# Patient Record
Sex: Female | Born: 1970 | Race: White | Hispanic: No | Marital: Single | Smoking: Current every day smoker
Health system: Southern US, Community
[De-identification: ages and names within clinical notes are randomized; demographics above are authoritative.]

## PROBLEM LIST (undated history)

## (undated) DIAGNOSIS — I10 Essential (primary) hypertension: Secondary | ICD-10-CM

---

## 1999-12-03 ENCOUNTER — Other Ambulatory Visit: Admission: RE | Admit: 1999-12-03 | Discharge: 1999-12-03 | Payer: Self-pay | Admitting: Obstetrics and Gynecology

## 2006-11-13 ENCOUNTER — Emergency Department (HOSPITAL_COMMUNITY): Admission: EM | Admit: 2006-11-13 | Discharge: 2006-11-13 | Payer: Self-pay | Admitting: Emergency Medicine

## 2009-05-30 ENCOUNTER — Ambulatory Visit: Payer: Self-pay | Admitting: Obstetrics & Gynecology

## 2009-06-04 ENCOUNTER — Encounter: Admission: RE | Admit: 2009-06-04 | Discharge: 2009-06-04 | Payer: Self-pay | Admitting: Obstetrics & Gynecology

## 2010-12-27 ENCOUNTER — Emergency Department (HOSPITAL_COMMUNITY): Payer: Self-pay

## 2010-12-27 ENCOUNTER — Emergency Department (INDEPENDENT_AMBULATORY_CARE_PROVIDER_SITE_OTHER): Payer: Self-pay

## 2010-12-27 ENCOUNTER — Emergency Department (HOSPITAL_BASED_OUTPATIENT_CLINIC_OR_DEPARTMENT_OTHER)
Admission: EM | Admit: 2010-12-27 | Discharge: 2010-12-27 | Disposition: A | Discharge status: Other Acute Inpatient Hospital | Payer: Self-pay | Attending: Emergency Medicine | Admitting: Emergency Medicine

## 2010-12-27 DIAGNOSIS — R079 Chest pain, unspecified: Secondary | ICD-10-CM | POA: Insufficient documentation

## 2010-12-27 LAB — POCT CARDIAC MARKERS
CKMB, poc: 1.3 ng/mL (ref 1.0–8.0)
Myoglobin, poc: 89.2 ng/mL (ref 12–200)
Myoglobin, poc: 60.4 ng/mL (ref 12–200)
Troponin i, poc: <0.05 ng/mL (ref 0.00–0.09)

## 2010-12-27 LAB — CBC
HCT: 38.5 % (ref 36.0–46.0)
Hemoglobin: 13.8 g/dL (ref 12.0–15.0)
MCHC: 35.8 g/dL (ref 30.0–36.0)
RBC: 4.65 MIL/uL (ref 3.87–5.11)
WBC: 8.1 10*3/uL (ref 4.0–10.5)

## 2010-12-27 LAB — DIFFERENTIAL
Basophils Absolute: 0 10*3/uL (ref 0.0–0.1)
Basophils Relative: 0 % (ref 0–1)
Lymphocytes Relative: 26 % (ref 12–46)
Monocytes Absolute: 0.4 10*3/uL (ref 0.1–1.0)
Neutro Abs: 5.3 10*3/uL (ref 1.7–7.7)
Neutrophils Relative %: 66 % (ref 43–77)

## 2010-12-27 LAB — BASIC METABOLIC PANEL
CO2: 23 mEq/L (ref 19–32)
Chloride: 111 mEq/L (ref 96–112)
Creatinine, Ser: 0.8 mg/dL (ref 0.4–1.2)
GFR calc Af Amer: >60 mL/min (ref >60)
Potassium: 3.9 mEq/L (ref 3.5–5.1)
Sodium: 142 mEq/L (ref 135–145)

## 2010-12-27 LAB — D-DIMER, QUANTITATIVE: D-Dimer, Quant: <0.22 ug/mL-FEU (ref 0.00–0.48)

## 2011-04-08 NOTE — Assessment & Plan Note (Signed)
Miranda Shepard, Miranda Shepard          ACCOUNT NO.:  [id number]   MEDICAL RECORD NO.:  [id number]          PATIENT TYPE:  POB   LOCATION:  [facility] at [place]         FACILITY:  [facility]   PHYSICIAN:  Elsie Lincoln, MD      DATE OF BIRTH:  12-18-1970   DATE OF SERVICE:  05/30/2009                                  CLINIC NOTE   The patient is a 40 year old G0 female whose LMP is May 26, 2009, who  presents for a lump in her left breast which has been very tender for  the last week, used to be red and hot, but has significantly decreased  in size and redness, the tenderness is gone now.  The patient denies any  nipple drainage or fevers.  The patient does have a history of boils in  her axilla that are painful and burst and drain.  She does not have any  of these currently now either.  The patient does have a history of  breast cancer in her mother.  Mother is diagnosed with breast cancer  around age 38.  Her mother is living.  She does not know if her mother  has had BRCA1 and 2 testing.  We have advised the patient to ask her  mother if she has been tested.  If she has not been tested, I advise  that her mother be tested.  If her mother refuses to be tested, then I  advise the patient to be tested.  Also of note, the patient has not had  a Pap smear since 1995.  I suggest that the patient come back for  complete physical exam.  The patient has a history of polyps in her  colon and has not had adequate followup.  I referred her to a  gastroenterologist to get a followup colonoscopy.   PAST MEDICAL HISTORY:  Arthritis, polyps in the intestine.   PAST SURGICAL HISTORY:  None.   OBSTETRICAL HISTORY:  Para 0.   GYNECOLOGIC HISTORY:  No abnormal Pap smears.   MENSTRUAL HISTORY:  Menarche at age 32, regular menstrual cycles with 27  days' cycles, menses last 3-7 days with heavy flow, moderate pain with  no bleeding between periods.  She is not sexually active currently.   ALLERGIES:   ASPIRIN.  No latex allergy.   MEDICATIONS:  None.   FAMILY HISTORY:  Uncle with diabetes.  Mother has had a heart attack.  Mother and brother have high blood pressure.  Mother has had  premenopausal breast cancer.  Great aunt and uncle have colon cancer.   SOCIAL HISTORY:  She smokes one pack per day for 22 years.  She does not  drink alcohol.  She does drink caffeinated beverages.  Does not do drugs  and has never been a victim of sexual or physical abuse.   Systemic review is positive for frequent headaches, nausea, vomiting,  and vaginal itching at the end of her menses.   PHYSICAL EXAMINATION:  GENERAL:  Well nourished, well developed in no  apparent distress.  HEENT:  Normocephalic, atraumatic.  Very poor dentition.  The patient is  going for dentures later this week.  CHEST:  Normal movement.  AXILLA:  Evidence  of old scars.  No lymphadenopathy.  BREASTS:  Small mass in the left breast around 1 o'clock, mildly tender.  No nipple discharge.  The rest of breasts bilaterally are normal.   ASSESSMENT/PLAN:  A 40 year old female with probable resolving left  breast abscess.  1. Diagnostic mammogram of left breast.  2. Refer to gastroenterologist for history of polyps.  3. Scheduled for a Pap smear and complete physical exam.  4. The patient to follow up with mother for history of breast cancer.           ______________________________  Elsie Lincoln, MD     KL/MEDQ  D:  05/30/2009  T:  05/31/2009  Job:  [id number]

## 2011-09-20 IMAGING — CR DG CHEST 2V
2 series · 2 of 2 positions shown · non-contrast
Comparison: None.

CLINICAL DATA: Chest pain

CHEST - 2 VIEW

[w chest pa]
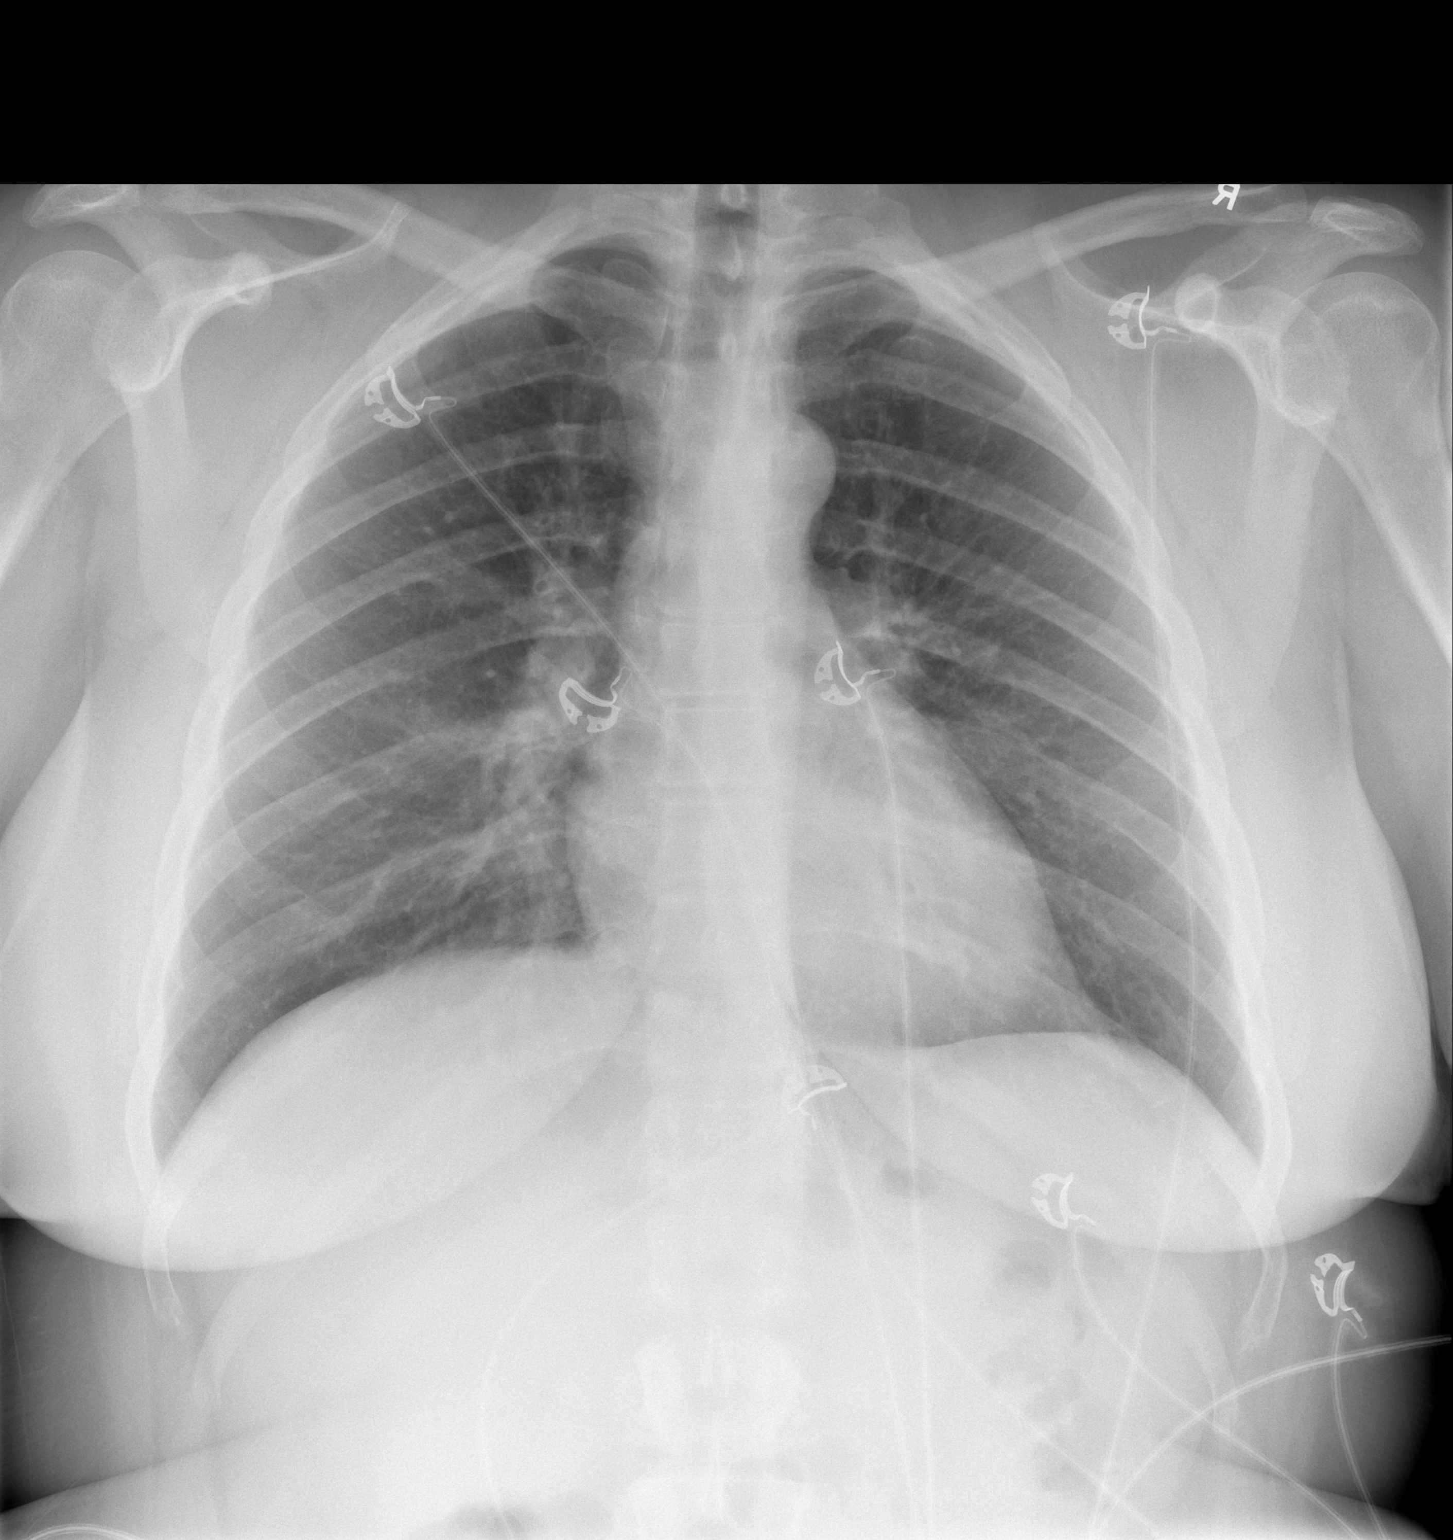

[w chest lat]
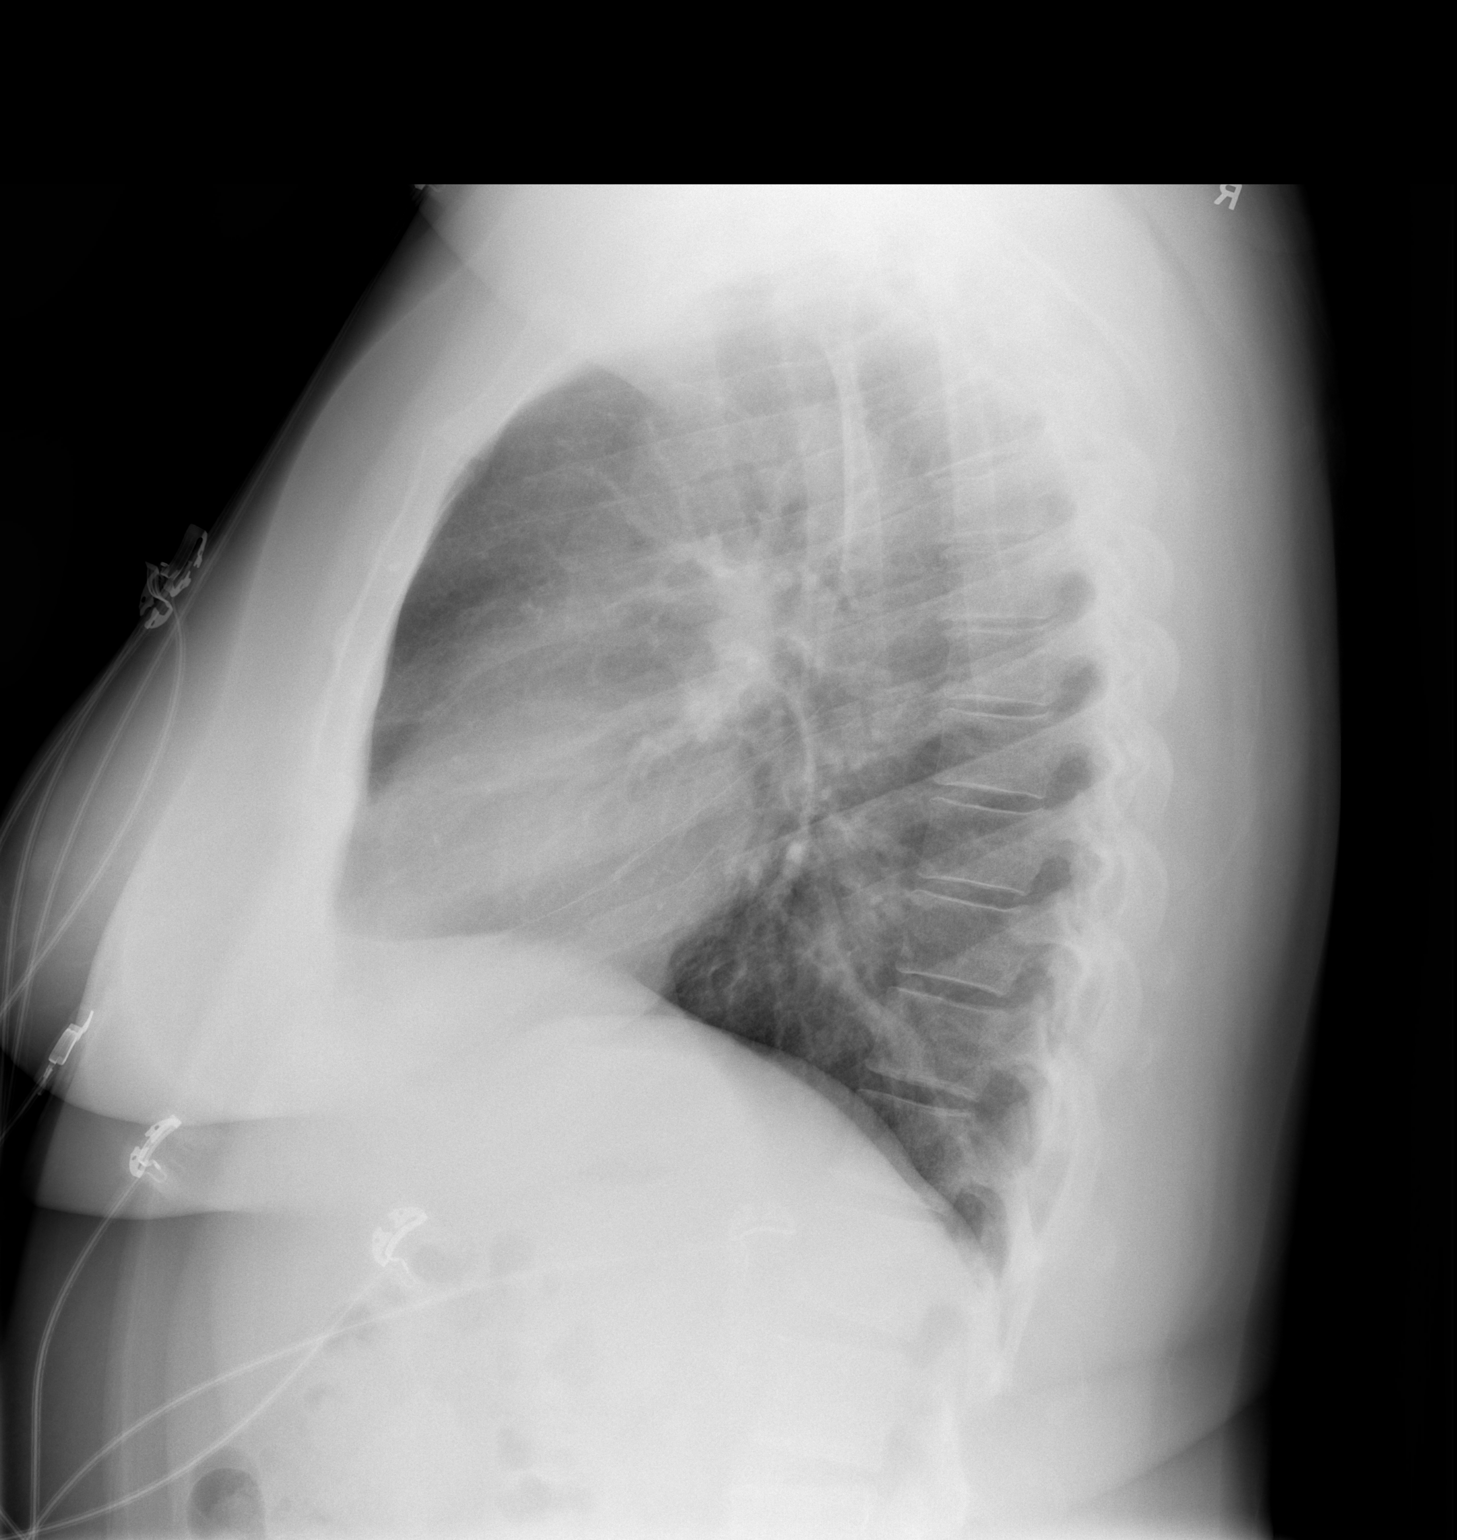

[2 of 2 positions shown; findings below may reference images not displayed]

FINDINGS: Heart and mediastinal contours normal.  Lungs clear.  No
pleural fluid.  Osseous structures and soft tissues unremarkable.
IMPRESSION: No acute or significant findings.

## 2016-10-06 ENCOUNTER — Other Ambulatory Visit (HOSPITAL_COMMUNITY)
Admission: RE | Admit: 2016-10-06 | Discharge: 2016-10-06 | Disposition: A | Discharge status: Home / Self Care | Payer: Managed Care, Other (non HMO) | Source: Ambulatory Visit | Attending: Obstetrics & Gynecology | Admitting: Obstetrics & Gynecology

## 2016-10-06 ENCOUNTER — Other Ambulatory Visit: Payer: Self-pay | Admitting: Obstetrics & Gynecology

## 2016-10-06 DIAGNOSIS — Z01419 Encounter for gynecological examination (general) (routine) without abnormal findings: Secondary | ICD-10-CM | POA: Insufficient documentation

## 2016-10-06 DIAGNOSIS — Z1151 Encounter for screening for human papillomavirus (HPV): Secondary | ICD-10-CM | POA: Diagnosis not present

## 2016-10-09 LAB — CYTOLOGY - PAP
DIAGNOSIS: NEGATIVE
HPV: NOT DETECTED

## 2016-11-05 ENCOUNTER — Other Ambulatory Visit: Payer: Self-pay | Admitting: Obstetrics & Gynecology

## 2017-07-27 ENCOUNTER — Other Ambulatory Visit: Payer: Self-pay

## 2017-07-27 ENCOUNTER — Encounter (HOSPITAL_COMMUNITY): Payer: Self-pay | Admitting: *Deleted

## 2017-07-27 ENCOUNTER — Emergency Department (HOSPITAL_COMMUNITY): Payer: No Typology Code available for payment source

## 2017-07-27 ENCOUNTER — Emergency Department (HOSPITAL_COMMUNITY)
Admission: EM | Admit: 2017-07-27 | Discharge: 2017-07-27 | Disposition: A | Discharge status: Home / Self Care | Payer: No Typology Code available for payment source | Attending: Emergency Medicine | Admitting: Emergency Medicine

## 2017-07-27 DIAGNOSIS — I10 Essential (primary) hypertension: Secondary | ICD-10-CM | POA: Diagnosis not present

## 2017-07-27 DIAGNOSIS — R531 Weakness: Secondary | ICD-10-CM | POA: Diagnosis present

## 2017-07-27 DIAGNOSIS — R0789 Other chest pain: Secondary | ICD-10-CM | POA: Diagnosis not present

## 2017-07-27 DIAGNOSIS — F17210 Nicotine dependence, cigarettes, uncomplicated: Secondary | ICD-10-CM | POA: Insufficient documentation

## 2017-07-27 DIAGNOSIS — J45909 Unspecified asthma, uncomplicated: Secondary | ICD-10-CM | POA: Diagnosis not present

## 2017-07-27 DIAGNOSIS — R42 Dizziness and giddiness: Secondary | ICD-10-CM | POA: Diagnosis not present

## 2017-07-27 HISTORY — DX: Essential (primary) hypertension: I10

## 2017-07-27 LAB — URINALYSIS, ROUTINE W REFLEX MICROSCOPIC
Bilirubin Urine: NEGATIVE
GLUCOSE, UA: NEGATIVE mg/dL
KETONES UR: NEGATIVE mg/dL
LEUKOCYTES UA: NEGATIVE
Nitrite: NEGATIVE
PH: 5 (ref 5.0–8.0)
Protein, ur: NEGATIVE mg/dL
RBC / HPF: NONE SEEN RBC/hpf (ref 0–5)
SPECIFIC GRAVITY, URINE: 1.005 (ref 1.005–1.030)
WBC, UA: NONE SEEN WBC/hpf (ref 0–5)

## 2017-07-27 LAB — HEPATIC FUNCTION PANEL
ALBUMIN: 3.5 g/dL (ref 3.5–5.0)
ALT: 15 U/L (ref 14–54)
AST: 21 U/L (ref 15–41)
Alkaline Phosphatase: 65 U/L (ref 38–126)
Bilirubin, Direct: <0.1 mg/dL — ABNORMAL LOW (ref 0.1–0.5)
TOTAL PROTEIN: 6.5 g/dL (ref 6.5–8.1)
Total Bilirubin: 0.3 mg/dL (ref 0.3–1.2)

## 2017-07-27 LAB — BASIC METABOLIC PANEL
Anion gap: 8 (ref 5–15)
BUN: 7 mg/dL (ref 6–20)
CHLORIDE: 106 mmol/L (ref 101–111)
CO2: 24 mmol/L (ref 22–32)
CREATININE: 0.76 mg/dL (ref 0.44–1.00)
Calcium: 8.5 mg/dL — ABNORMAL LOW (ref 8.9–10.3)
GFR calc Af Amer: >60 mL/min (ref >60)
Glucose, Bld: 156 mg/dL — ABNORMAL HIGH (ref 65–99)
Potassium: 3.7 mmol/L (ref 3.5–5.1)
SODIUM: 138 mmol/L (ref 135–145)

## 2017-07-27 LAB — CBC
HCT: 37.3 % (ref 36.0–46.0)
Hemoglobin: 12.8 g/dL (ref 12.0–15.0)
MCH: 29.5 pg (ref 26.0–34.0)
MCHC: 34.3 g/dL (ref 30.0–36.0)
MCV: 85.9 fL (ref 78.0–100.0)
PLATELETS: 272 10*3/uL (ref 150–400)
RBC: 4.34 MIL/uL (ref 3.87–5.11)
RDW: 13.1 % (ref 11.5–15.5)
WBC: 7.8 10*3/uL (ref 4.0–10.5)

## 2017-07-27 LAB — I-STAT TROPONIN, ED: Troponin i, poc: 0.01 ng/mL (ref 0.00–0.08)

## 2017-07-27 LAB — CBG MONITORING, ED: Glucose-Capillary: 160 mg/dL — ABNORMAL HIGH (ref 65–99)

## 2017-07-27 MED ORDER — ASPIRIN EC 325 MG PO TBEC
325.0000 mg | DELAYED_RELEASE_TABLET | Freq: Once | ORAL | Status: AC
  Administered 2017-07-27: 325 mg via ORAL
  Filled 2017-07-27: qty 1

## 2017-07-27 MED ORDER — SODIUM CHLORIDE 0.9 % IV BOLUS (SEPSIS)
500.0000 mL | Freq: Once | INTRAVENOUS | Status: AC
  Administered 2017-07-27: 500 mL via INTRAVENOUS

## 2017-07-27 MED ORDER — ONDANSETRON HCL 4 MG/2ML IJ SOLN
4.0000 mg | Freq: Once | INTRAMUSCULAR | Status: AC
  Administered 2017-07-27: 4 mg via INTRAVENOUS
  Filled 2017-07-27: qty 2

## 2017-07-27 NOTE — ED Triage Notes (Signed)
Pt c/o dizziness onset x 1 mth intermittent with worsening over the last 3 days, denies vision changes, denies n/v/d. Pt A&O x4, no facial droop present, no slurred speech

## 2017-07-27 NOTE — ED Notes (Signed)
Patient reported feeling weak and light headed while standing.  Vital signs were unchanged.

## 2017-07-27 NOTE — ED Provider Notes (Signed)
[facility]   CSN: [id number] Arrival date & time: 07/27/17  1035     History   Chief Complaint No chief complaint on file.   HPI Miranda Shepard is a 46 y.o. female with a h/o of asthma and HTN who presents to the emergency department with a chief complaint of generalized weakness. 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 She also reports associated worse than baseline dyspnea that began this morning.  She reports intermittent lightheadedness, feeling as if "she might pass out" that would last for a few hours over the last week, but became constant 3 nights ago. She reports that when she felt an episode of lightheadedness coming on that she would go lay down, and the symptom would resolve when she awoke until 3 nights ago.   She also reports constant, non-radiating left-sided chest pressure that began 2 days ago. She denies numbness, tingling, or diaphoresis.   She reports that she has not eaten this morning, and has only drank one [employer] since this AM.   Past medical history includes asthma and hypertension for which she takes albuterol as needed, ASA, Norvasc, and losartan. She is a current, every day smoker.  Family medical history includes her brother who had an MI at age 48 and CABG x3 at age 48, and her mother who had an MI in her 1640s. She reports she is not established with cardiology, but was sent for a stress test by her PCP in January, which was unremarkable.   The history is provided by the patient and the spouse. No language interpreter was used.    Past Medical History:  Diagnosis Date  . Hypertension     There are no active problems to display for this patient.   History reviewed. No pertinent surgical history.  OB History    No data  available       Home Medications    Prior to Admission medications   Not on File    Family History No family history on file.  Social History Social History  Substance Use Topics  . Smoking status: Current Every Day Smoker    Packs/day: 0.50    Types: Cigarettes  . Smokeless tobacco: Never Used  . Alcohol use No     Allergies   Patient has no allergy information on record.   Review of Systems Review of Systems  Constitutional: Negative for activity change, chills and fever.  Eyes: Negative for visual disturbance.  Respiratory: Positive for cough and shortness of breath.   Cardiovascular: Positive for chest pain.  Gastrointestinal: Positive for nausea. Negative for abdominal pain, diarrhea and vomiting.  Musculoskeletal: Negative for arthralgias, back pain and myalgias.  Skin: Negative for rash.  Neurological: Positive for weakness and light-headedness.   Physical Exam Updated Vital Signs BP 115/67   Pulse 66   Temp 98.5 F (36.9 C) (Oral)   Resp 18   Ht 5\' 3"  (1.6 m)   Wt 108.9 kg (240 lb)   SpO2 100%   BMI 42.51 kg/m   Physical Exam  Constitutional: No distress.  Obese female  HENT:  Head: Normocephalic.  Eyes: Conjunctivae are normal.  Neck: Neck supple. No JVD present.  Cardiovascular: Normal rate, regular rhythm, normal heart sounds and intact distal pulses.  Exam reveals no gallop and no friction rub.   No murmur heard. 2+ and equal radial, DP, and PT pulses.  Pulmonary/Chest: Effort normal and breath sounds normal. No respiratory distress. She has no wheezes. She has no rales. She exhibits no tenderness.  Abdominal: Soft. She exhibits no distension and no mass. There is tenderness. There is no rebound and no guarding. No hernia.  Mild tenderness to palpation in the right upper quadrant. No CVA tenderness bilaterally.   Neurological: She is alert.  Cranial nerves 2-12 intact. Finger-to-nose is normal. 5/5 motor strength of the bilateral upper  and lower extremities. Moves all four extremities. Negative Romberg. Ambulatory without difficulty. NVI.    Skin: Skin is warm. Capillary refill takes less than 2 seconds. No rash noted.  Psychiatric: Her behavior is normal.  Nursing note and vitals reviewed.  ED Treatments / Results  Labs (all labs ordered are listed, but only abnormal results are displayed) Labs Reviewed  BASIC METABOLIC PANEL - Abnormal; Notable for the following:       Result Value   Glucose, Bld 156 (*)    Calcium 8.5 (*)    All other components within normal limits  URINALYSIS, ROUTINE W REFLEX MICROSCOPIC - Abnormal; Notable for the following:    Color, Urine COLORLESS (*)    Hgb urine dipstick SMALL (*)    Bacteria, UA RARE (*)    Squamous Epithelial / LPF 0-5 (*)    All other components within normal limits  HEPATIC FUNCTION PANEL - Abnormal; Notable for the following:    Bilirubin, Direct <0.1 (*)    All other components within normal limits  CBG MONITORING, ED - Abnormal; Notable for the following:    Glucose-Capillary 160 (*)    All other components within normal limits  CBC  I-STAT TROPONIN, ED    EKG  EKG Interpretation  Date/Time:  Monday July 27 2017 10:41:21 EDT Ventricular Rate:  80 PR Interval:  164 QRS Duration: 86 QT Interval:  404 QTC Calculation: 465 R Axis:   49 Text Interpretation:  Normal sinus rhythm Normal ECG No old tracing to compare Confirmed by [place], Doreatha Martin [phone]) on 07/27/2017 2:49:25 PM       Radiology Dg Chest 2 View  Result Date: 07/27/2017 CLINICAL DATA:  Left chest pressure, dyspnea, dizziness EXAM: CHEST  2 VIEW COMPARISON:  05/28/2017 FINDINGS: The heart size and mediastinal contours are within normal limits. Both lungs are clear. The visualized skeletal structures are unremarkable. IMPRESSION: No active cardiopulmonary disease. Electronically Signed   By: Judie Petit.  Shick M.D.   On: 07/27/2017 13:06    Procedures Procedures (including critical care  time)  Medications Ordered in ED Medications  aspirin EC tablet 325 mg (325 mg Oral Given 07/27/17 1239)  sodium chloride 0.9 % bolus 500 mL (0 mLs Intravenous Stopped 07/27/17 1519)  ondansetron (ZOFRAN) injection 4 mg (4 mg Intravenous Given 07/27/17 1422)     Initial Impression / Assessment and Plan / ED Course  I have reviewed the triage vital signs and the nursing notes.  Pertinent labs & imaging results that were available during my care of the patient were reviewed by me and considered in my medical decision making (see chart for details).     46 year old female presenting with lightheadedness, chest pressure, worse than baseline dyspnea, and generalized weakness. The patient was seen and evaluated by Dr. Ethelda Chick, attending physician. Heart Score 2.  Patient is to be discharged with recommendation to follow up with PCP in regards  to today's hospital visit. Chest pain is not likely of cardiac or pulmonary etiology d/t presentation, PERC negative, VSS, no tracheal deviation, no JVD or new murmur, RRR, breath sounds equal bilaterally, EKG without acute abnormalities, negative troponin, and negative CXR. Pt has been advised to return to the ED if CP becomes exertional, associated with diaphoresis or nausea, radiates to left jaw/arm, worsens or becomes concerning in any way.   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 Orthostatic vital signs are unremarkable. No clinical signs of dehydration; however patients reports improvement of her symptoms after a bolus of IVF.   Encouraged the patient to switch to diet beverages and/or switch to water or a drink with electrolytes and try to consume at least 6-8 glasses of at least 8 oz daily. Counseled on smoking cessation and encouraged her to follow up with her PCP regarding to day's visit. Pt appears reliable for follow up and is agreeable to  discharge.     Final Clinical Impressions(s) / ED Diagnoses   Final diagnoses:  Lightheadedness  Atypical chest pain    New Prescriptions There are no discharge medications for this patient.    Frederik Pear A, PA-C 07/28/17 1428    Doug Sou, MD 07/28/17 [phone]

## 2017-07-27 NOTE — ED Provider Notes (Signed)
Plains of lightheadedness for one month.. Worse with standing. She remains thirsty constantly. She states she works in a hot environment and drinks [employer] frequently. She also complains of pleuritic mild left anterior chest pain which has been constant for the past 2 days. On exam no distress lungs clear auscultation heart regular rate and rhythm abdomen obese nontender extremities without edema. Gait is normal. She's not lightheaded on standing after treatment with intravenous hydration. Oral hydration encouraged. I counseled patient for 5 minutes on smoking cessation   Doug SouJacubowitz, Abbott Jasinski, MD 07/27/17 [phone]

## 2017-07-27 NOTE — Discharge Instructions (Signed)
Please follow-up with your primary care physician regarding today's visit. Please have your doctor check an A1c since her blood sugar was high today. Please also your doctor know that you have been having heart fluttering because they may want to refer you to cardiology for holter monitoring. Please also talk to your doctor about quitting smoking because this can increase your blood pressure and increase your risk for heart problems.   I would also recommend switching to diet sodas that do not contain large sugar. Please also try to drink 6-8 glasses of water or Gatorade per day to avoid getting dehydrated since you work in a hot environment.  If you develop any new or worsening symptoms, including worsening chest pain or pressure, worsening shortness of breath, or any new or severe symptoms, please return to the emergency department for evaluation.

## 2019-05-02 IMAGING — DX DG CHEST 2V
2 series · 2 of 2 positions shown · non-contrast
Comparison: 05/28/2017

CLINICAL DATA: Left chest pressure, dyspnea, dizziness

EXAM:
CHEST  2 VIEW

[chest pa]
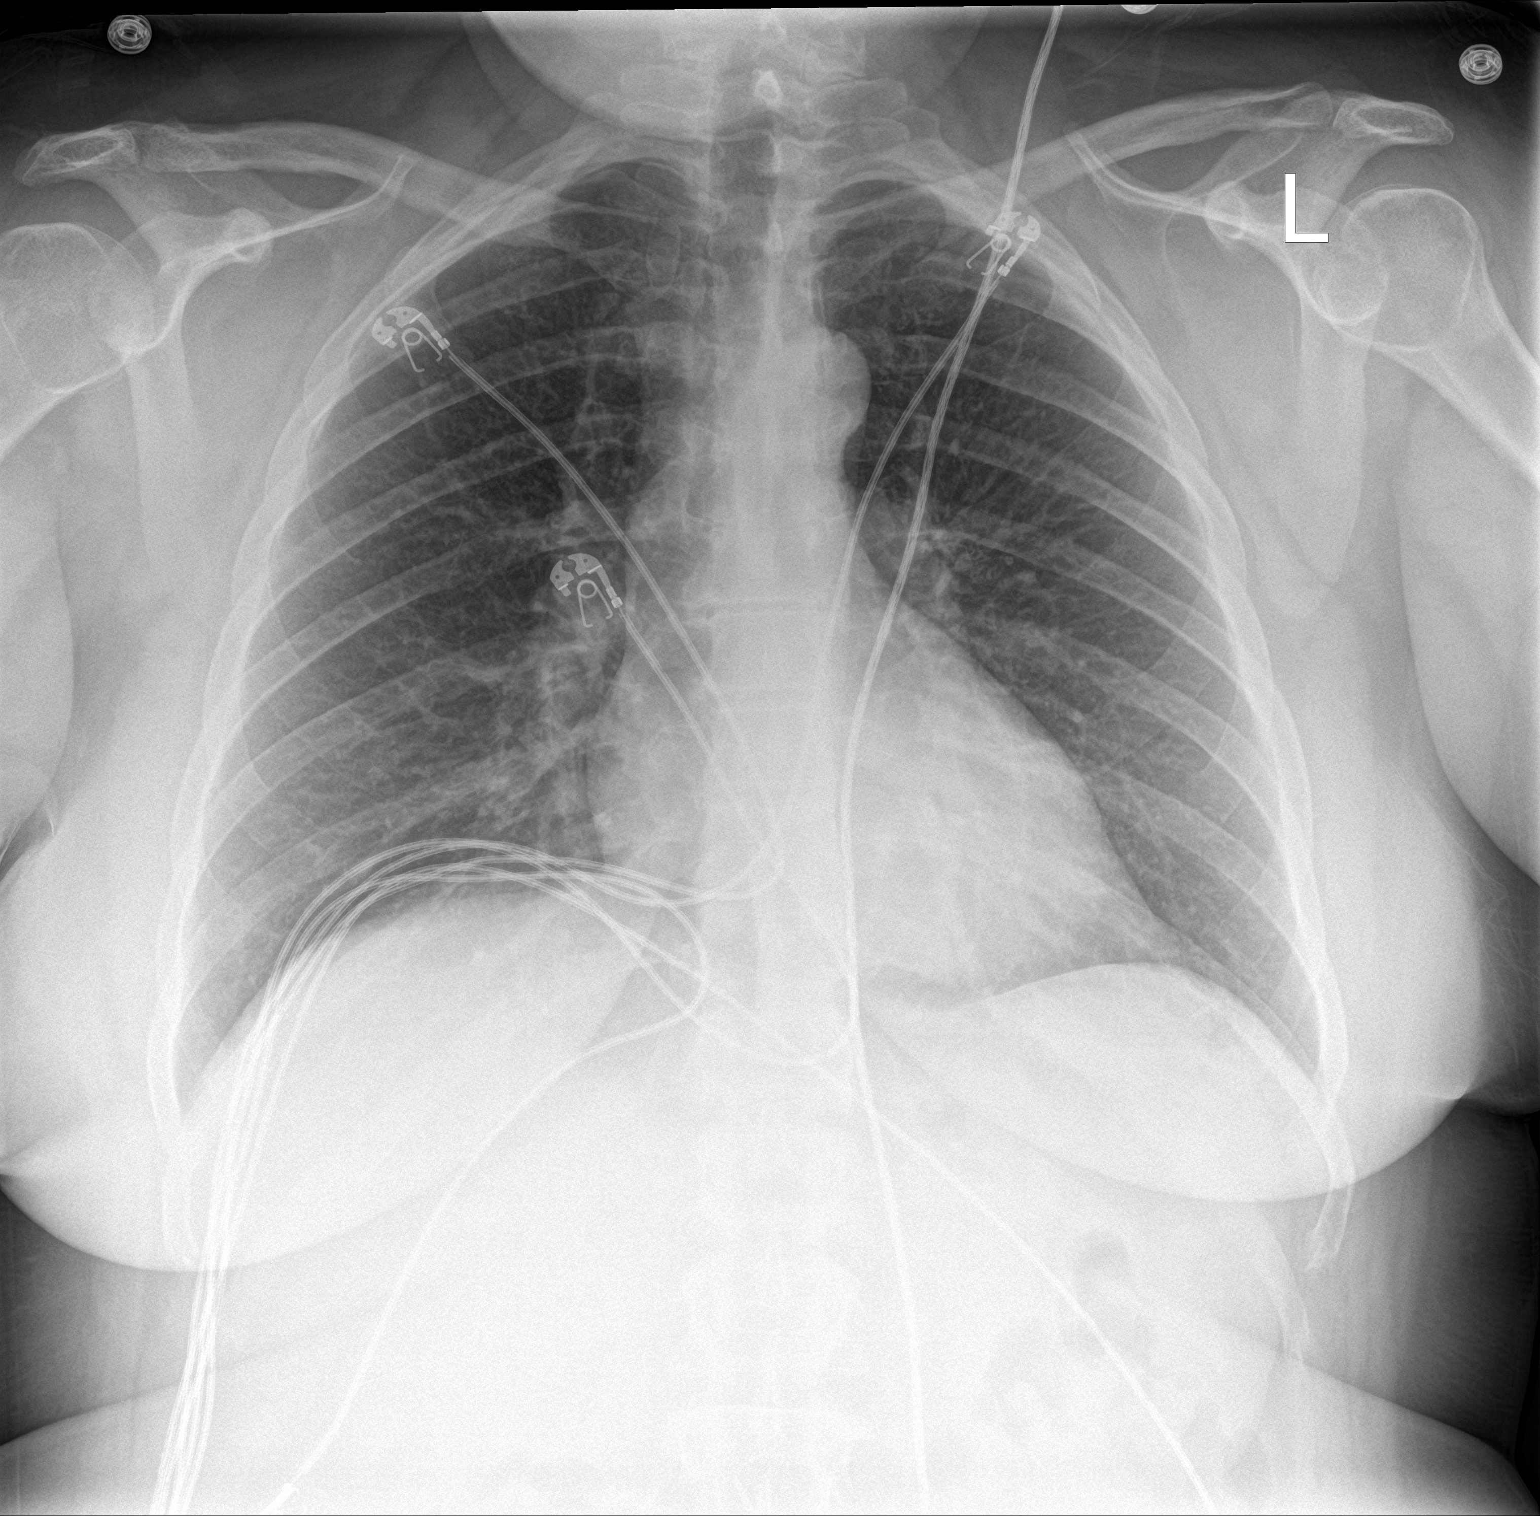

[chest lat]
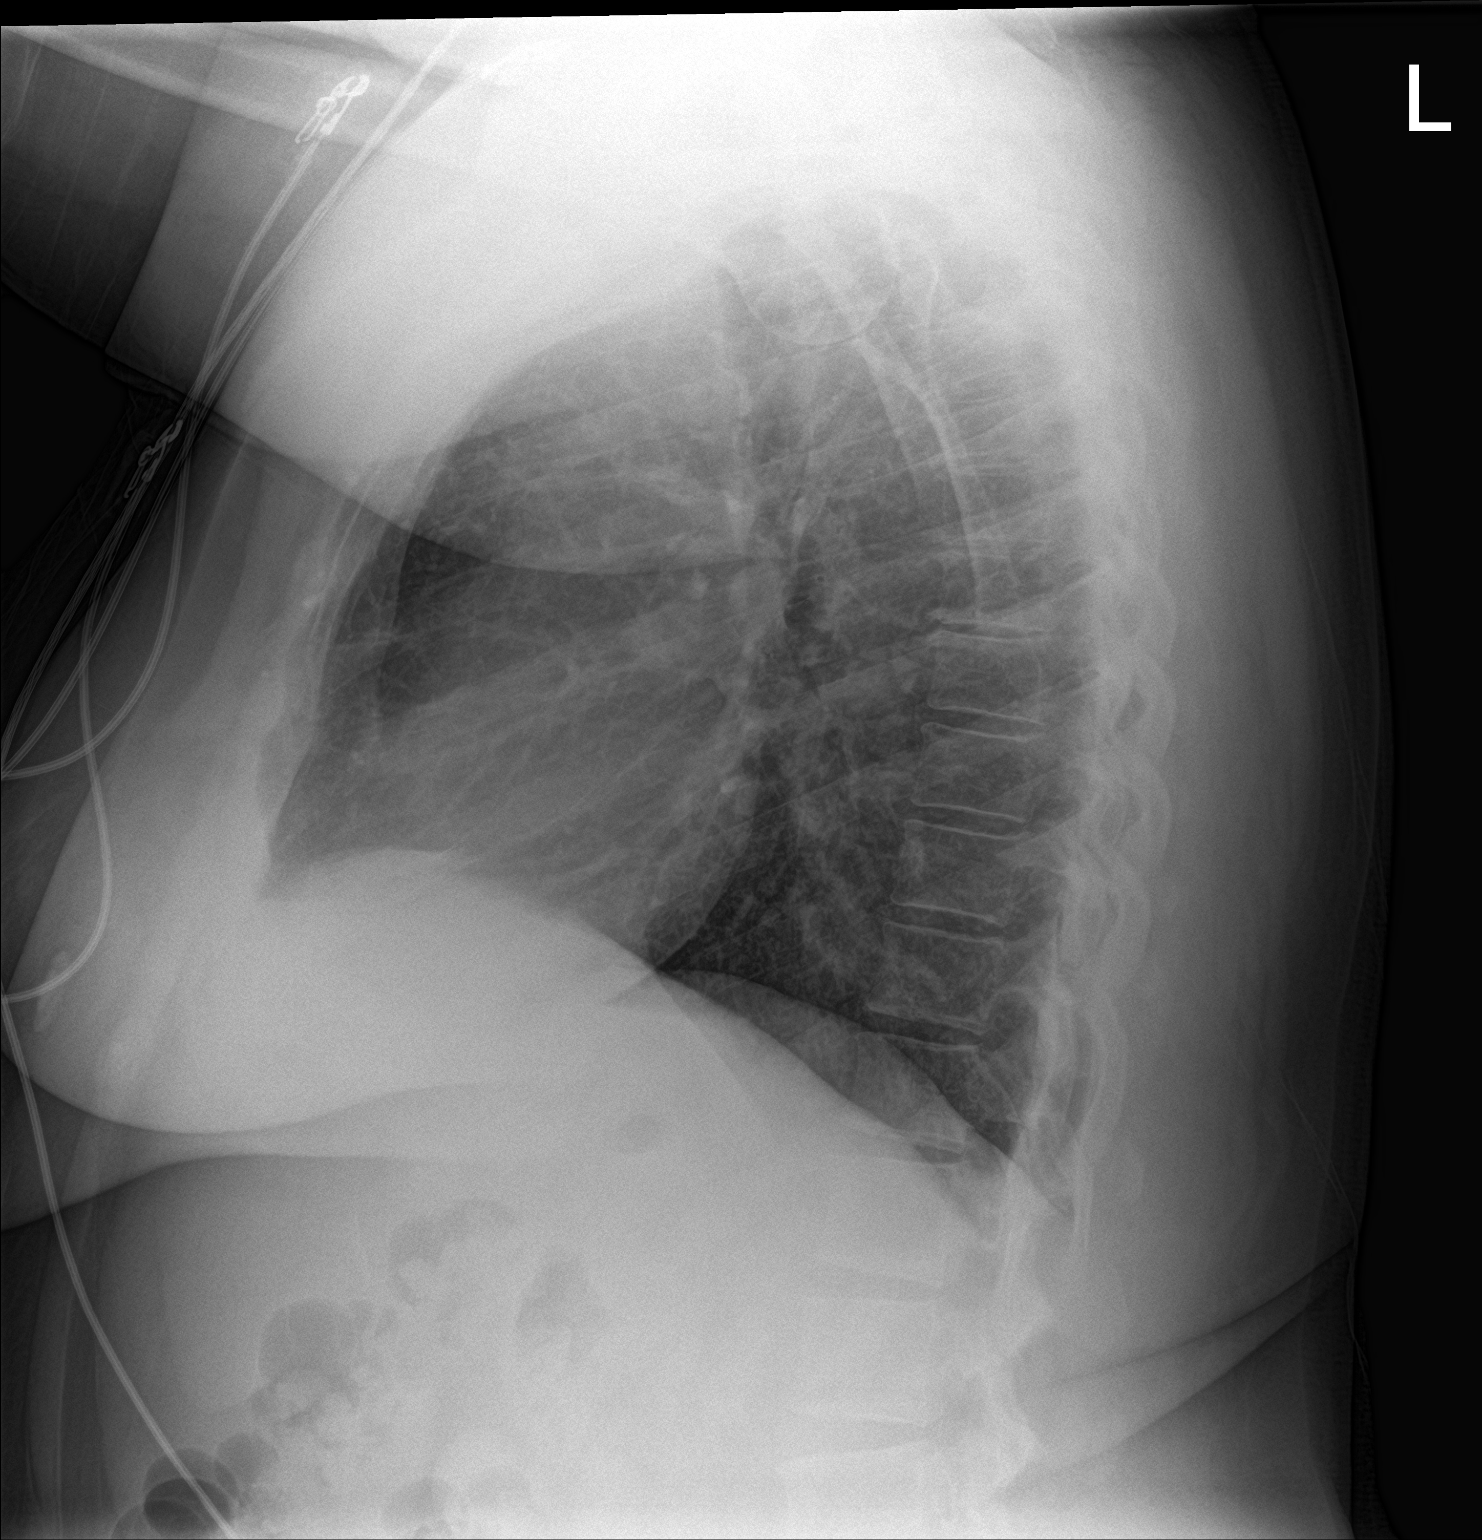

[2 of 2 positions shown; findings below may reference images not displayed]

FINDINGS: The heart size and mediastinal contours are within normal limits.
Both lungs are clear. The visualized skeletal structures are
unremarkable.
IMPRESSION: No active cardiopulmonary disease.

## 2020-03-13 ENCOUNTER — Emergency Department (HOSPITAL_COMMUNITY)
Admission: EM | Admit: 2020-03-13 | Discharge: 2020-03-13 | Disposition: A | Discharge status: Home / Self Care | Payer: No Typology Code available for payment source | Attending: Emergency Medicine | Admitting: Emergency Medicine

## 2020-03-13 ENCOUNTER — Other Ambulatory Visit: Payer: Self-pay

## 2020-03-13 ENCOUNTER — Encounter (HOSPITAL_COMMUNITY): Payer: Self-pay | Admitting: Emergency Medicine

## 2020-03-13 DIAGNOSIS — R42 Dizziness and giddiness: Secondary | ICD-10-CM | POA: Insufficient documentation

## 2020-03-13 DIAGNOSIS — Z5321 Procedure and treatment not carried out due to patient leaving prior to being seen by health care provider: Secondary | ICD-10-CM | POA: Insufficient documentation

## 2020-03-13 LAB — URINALYSIS, ROUTINE W REFLEX MICROSCOPIC
Specific Gravity, Urine: 1.02 (ref 1.005–1.030)
pH: 6.5 (ref 5.0–8.0)

## 2020-03-13 LAB — I-STAT BETA HCG BLOOD, ED (MC, WL, AP ONLY): I-stat hCG, quantitative: <5 m[IU]/mL (ref <5)

## 2020-03-13 LAB — CBC
HCT: 39.6 % (ref 36.0–46.0)
Hemoglobin: 13.2 g/dL (ref 12.0–15.0)
MCH: 28.6 pg (ref 26.0–34.0)
MCHC: 33.3 g/dL (ref 30.0–36.0)
MCV: 85.9 fL (ref 80.0–100.0)
Platelets: 320 10*3/uL (ref 150–400)
RBC: 4.61 MIL/uL (ref 3.87–5.11)
RDW: 13.4 % (ref 11.5–15.5)
WBC: 7.6 10*3/uL (ref 4.0–10.5)
nRBC: 0 % (ref 0.0–0.2)

## 2020-03-13 LAB — BASIC METABOLIC PANEL
Anion gap: 8 (ref 5–15)
BUN: 10 mg/dL (ref 6–20)
CO2: 25 mmol/L (ref 22–32)
Calcium: 8.7 mg/dL — ABNORMAL LOW (ref 8.9–10.3)
Chloride: 107 mmol/L (ref 98–111)
Creatinine, Ser: 0.78 mg/dL (ref 0.44–1.00)
GFR calc Af Amer: >60 mL/min (ref >60)
GFR calc non Af Amer: >60 mL/min (ref >60)
Glucose, Bld: 88 mg/dL (ref 70–99)
Potassium: 3.6 mmol/L (ref 3.5–5.1)
Sodium: 140 mmol/L (ref 135–145)

## 2020-03-13 LAB — URINALYSIS, MICROSCOPIC (REFLEX)
RBC / HPF: >50 RBC/hpf (ref 0–5)
WBC, UA: NONE SEEN WBC/hpf (ref 0–5)

## 2020-03-13 LAB — CBG MONITORING, ED
Glucose-Capillary: 81 mg/dL (ref 70–99)
Glucose-Capillary: 99 mg/dL (ref 70–99)

## 2020-03-13 MED ORDER — ACETAMINOPHEN 325 MG PO TABS
650.0000 mg | ORAL_TABLET | Freq: Once | ORAL | Status: AC | PRN
  Administered 2020-03-13: 650 mg via ORAL
  Filled 2020-03-13: qty 2

## 2020-03-13 NOTE — ED Triage Notes (Signed)
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 Symptoms have improved. Reports taking her HTN meds today. No weakness noted.

## 2020-03-13 NOTE — ED Notes (Signed)
Pt came to desk and said she was leaving was not going to wait. Advised not to

## 2020-03-13 NOTE — ED Notes (Signed)
Brett Absher [phone]) called/would like a call when or if discharge.
